# Patient Record
Sex: Male | Born: 1980 | Race: Black or African American | Hispanic: No | Marital: Married | Smoking: Never smoker
Health system: Southern US, Community
[De-identification: ages and names within clinical notes are randomized; demographics above are authoritative.]

## PROBLEM LIST (undated history)

## (undated) DIAGNOSIS — I10 Essential (primary) hypertension: Secondary | ICD-10-CM

---
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
## 2009-03-24 ENCOUNTER — Emergency Department (HOSPITAL_COMMUNITY): Admission: EM | Admit: 2009-03-24 | Discharge: 2009-03-24 | Payer: Self-pay | Admitting: Emergency Medicine

## 2010-06-09 ENCOUNTER — Encounter: Payer: Self-pay | Admitting: Sports Medicine

## 2011-01-27 ENCOUNTER — Emergency Department (HOSPITAL_COMMUNITY)
Admission: EM | Admit: 2011-01-27 | Discharge: 2011-01-27 | Disposition: A | Discharge status: Home / Self Care | Payer: Self-pay | Attending: Emergency Medicine | Admitting: Emergency Medicine

## 2011-01-27 ENCOUNTER — Emergency Department (HOSPITAL_COMMUNITY): Payer: Self-pay

## 2011-01-27 DIAGNOSIS — R05 Cough: Secondary | ICD-10-CM | POA: Insufficient documentation

## 2011-01-27 DIAGNOSIS — R000 Tachycardia, unspecified: Secondary | ICD-10-CM | POA: Insufficient documentation

## 2011-01-27 DIAGNOSIS — R0789 Other chest pain: Secondary | ICD-10-CM | POA: Insufficient documentation

## 2011-01-27 DIAGNOSIS — R071 Chest pain on breathing: Secondary | ICD-10-CM | POA: Insufficient documentation

## 2011-01-27 DIAGNOSIS — R059 Cough, unspecified: Secondary | ICD-10-CM | POA: Insufficient documentation

## 2011-01-27 DIAGNOSIS — R0602 Shortness of breath: Secondary | ICD-10-CM | POA: Insufficient documentation

## 2011-01-27 LAB — POCT I-STAT, CHEM 8
BUN: 9 mg/dL (ref 6–23)
Creatinine, Ser: 1.2 mg/dL (ref 0.50–1.35)
Potassium: 3.9 mEq/L (ref 3.5–5.1)
Sodium: 140 mEq/L (ref 135–145)

## 2011-01-27 LAB — D-DIMER, QUANTITATIVE: D-Dimer, Quant: 1.48 ug/mL-FEU — ABNORMAL HIGH (ref 0.00–0.48)

## 2011-01-27 MED ORDER — IOHEXOL 300 MG/ML  SOLN
100.0000 mL | Freq: Once | INTRAMUSCULAR | Status: AC | PRN
  Administered 2011-01-27: 100 mL via INTRAVENOUS

## 2011-10-12 ENCOUNTER — Encounter (HOSPITAL_COMMUNITY): Payer: Self-pay | Admitting: Family Medicine

## 2011-10-12 ENCOUNTER — Emergency Department (HOSPITAL_COMMUNITY): Payer: Self-pay

## 2011-10-12 ENCOUNTER — Emergency Department (HOSPITAL_COMMUNITY)
Admission: EM | Admit: 2011-10-12 | Discharge: 2011-10-12 | Disposition: A | Discharge status: Home / Self Care | Payer: Self-pay | Attending: Emergency Medicine | Admitting: Emergency Medicine

## 2011-10-12 DIAGNOSIS — I10 Essential (primary) hypertension: Secondary | ICD-10-CM | POA: Insufficient documentation

## 2011-10-12 DIAGNOSIS — R1110 Vomiting, unspecified: Secondary | ICD-10-CM | POA: Insufficient documentation

## 2011-10-12 DIAGNOSIS — M549 Dorsalgia, unspecified: Secondary | ICD-10-CM | POA: Insufficient documentation

## 2011-10-12 DIAGNOSIS — J45909 Unspecified asthma, uncomplicated: Secondary | ICD-10-CM | POA: Insufficient documentation

## 2011-10-12 DIAGNOSIS — J189 Pneumonia, unspecified organism: Secondary | ICD-10-CM | POA: Insufficient documentation

## 2011-10-12 HISTORY — DX: Essential (primary) hypertension: I10

## 2011-10-12 LAB — COMPREHENSIVE METABOLIC PANEL
Alkaline Phosphatase: 88 U/L (ref 39–117)
BUN: 5 mg/dL — ABNORMAL LOW (ref 6–23)
CO2: 25 mEq/L (ref 19–32)
GFR calc Af Amer: >90 mL/min (ref >90)
GFR calc non Af Amer: >90 mL/min (ref >90)
Glucose, Bld: 115 mg/dL — ABNORMAL HIGH (ref 70–99)
Potassium: 4.3 mEq/L (ref 3.5–5.1)
Total Bilirubin: 0.3 mg/dL (ref 0.3–1.2)
Total Protein: 8.2 g/dL (ref 6.0–8.3)

## 2011-10-12 LAB — CBC
Hemoglobin: 14.6 g/dL (ref 13.0–17.0)
MCH: 25.1 pg — ABNORMAL LOW (ref 26.0–34.0)
MCV: 73.5 fL — ABNORMAL LOW (ref 78.0–100.0)
RBC: 5.81 MIL/uL (ref 4.22–5.81)

## 2011-10-12 LAB — DIFFERENTIAL
Eosinophils Absolute: 0 10*3/uL (ref 0.0–0.7)
Lymphs Abs: 1 10*3/uL (ref 0.7–4.0)
Monocytes Relative: 3 % (ref 3–12)
Neutrophils Relative %: 89 % — ABNORMAL HIGH (ref 43–77)

## 2011-10-12 MED ORDER — AZITHROMYCIN 250 MG PO TABS
500.0000 mg | ORAL_TABLET | Freq: Once | ORAL | Status: AC
  Administered 2011-10-12: 500 mg via ORAL
  Filled 2011-10-12: qty 2

## 2011-10-12 MED ORDER — TRIAMTERENE-HCTZ 37.5-25 MG PO TABS: 1.0000 | ORAL_TABLET | Freq: Every day | ORAL | Status: DC

## 2011-10-12 MED ORDER — IBUPROFEN 800 MG PO TABS
800.0000 mg | ORAL_TABLET | Freq: Once | ORAL | Status: AC
  Administered 2011-10-12: 800 mg via ORAL
  Filled 2011-10-12: qty 1

## 2011-10-12 MED ORDER — SODIUM CHLORIDE 0.9 % IV BOLUS (SEPSIS)
1000.0000 mL | Freq: Once | INTRAVENOUS | Status: AC
  Administered 2011-10-12: 1000 mL via INTRAVENOUS

## 2011-10-12 MED ORDER — IOHEXOL 350 MG/ML SOLN
80.0000 mL | Freq: Once | INTRAVENOUS | Status: AC | PRN
  Administered 2011-10-12: 80 mL via INTRAVENOUS

## 2011-10-12 MED ORDER — AZITHROMYCIN 250 MG PO TABS: 250.0000 mg | ORAL_TABLET | Freq: Every day | ORAL | Status: AC

## 2011-10-12 MED ORDER — OXYCODONE-ACETAMINOPHEN 5-325 MG PO TABS: 2.0000 | ORAL_TABLET | ORAL | Status: AC | PRN

## 2011-10-12 MED ORDER — ALBUTEROL SULFATE (5 MG/ML) 0.5% IN NEBU
INHALATION_SOLUTION | RESPIRATORY_TRACT | Status: AC
  Filled 2011-10-12: qty 0.5

## 2011-10-12 MED ORDER — ALBUTEROL SULFATE HFA 108 (90 BASE) MCG/ACT IN AERS
2.0000 | INHALATION_SPRAY | RESPIRATORY_TRACT | Status: DC
  Administered 2011-10-12: 2 via RESPIRATORY_TRACT
  Filled 2011-10-12: qty 6.7

## 2011-10-12 MED ORDER — ALBUTEROL SULFATE (5 MG/ML) 0.5% IN NEBU
5.0000 mg | INHALATION_SOLUTION | Freq: Once | RESPIRATORY_TRACT | Status: AC
  Administered 2011-10-12: 5 mg via RESPIRATORY_TRACT
  Filled 2011-10-12: qty 1

## 2011-10-12 MED ORDER — ONDANSETRON HCL 4 MG/2ML IJ SOLN
4.0000 mg | Freq: Once | INTRAMUSCULAR | Status: AC
  Administered 2011-10-12: 4 mg via INTRAVENOUS
  Filled 2011-10-12: qty 2

## 2011-10-12 MED ORDER — ALBUTEROL SULFATE (5 MG/ML) 0.5% IN NEBU
5.0000 mg | INHALATION_SOLUTION | Freq: Once | RESPIRATORY_TRACT | Status: AC
  Administered 2011-10-12: 5 mg via RESPIRATORY_TRACT
  Filled 2011-10-12: qty 0.5

## 2011-10-12 MED ORDER — OXYCODONE-ACETAMINOPHEN 5-325 MG PO TABS
2.0000 | ORAL_TABLET | Freq: Once | ORAL | Status: AC
  Administered 2011-10-12: 2 via ORAL
  Filled 2011-10-12: qty 2

## 2011-10-12 NOTE — ED Notes (Signed)
Pt. Is drinking gingerale and tolerating

## 2011-10-12 NOTE — ED Notes (Signed)
Discharge instructions given along with 3 prescriptions.  Inhaler given and use instructed

## 2011-10-12 NOTE — ED Provider Notes (Signed)
History     CSN: [id number]  Arrival date & time 10/12/11  1328   First MD Initiated Contact with Patient 10/12/11 1501      Chief Complaint  Patient presents with  . Fever  . Emesis  . Back Pain    (Consider location/radiation/quality/duration/timing/severity/associated sxs/prior treatment) HPI Comments: Pt states that he is out of his medications including his chronic back pain medication:pt states that he has had vomiting without diarrhea;Pt states that he smoked marijuana yesterday to help with the nausea:pt has not taken anything for the fever  Patient is a 31 y.o. male presenting with fever. The history is provided by the patient. No language interpreter was used.  Fever Primary symptoms of the febrile illness include fever, cough, wheezing, shortness of breath and vomiting. Primary symptoms do not include nausea or diarrhea. The current episode started yesterday. This is a new problem. The problem has not changed since onset.   Past Medical History  Diagnosis Date  . Hypertension   . Asthma     History reviewed. No pertinent past surgical history.  History reviewed. No pertinent family history.  History  Substance Use Topics  . Smoking status: Not on file  . Smokeless tobacco: Not on file  . Alcohol Use:       Review of Systems  Constitutional: Positive for fever.  HENT: Negative.   Eyes: Negative.   Respiratory: Positive for cough, shortness of breath and wheezing.   Cardiovascular: Negative.   Gastrointestinal: Positive for vomiting. Negative for nausea and diarrhea.  Genitourinary: Negative.     Allergies  Review of patient's allergies indicates no known allergies.  Home Medications   Current Outpatient Rx  Name Route Sig Dispense Refill  . ALBUTEROL SULFATE HFA 108 (90 BASE) MCG/ACT IN AERS Inhalation Inhale 2 puffs into the lungs every 6 (six) hours as needed. Shortness of breath    . ALPRAZOLAM 1 MG PO TABS Oral Take 1 mg by mouth 2 (two)  times daily as needed. anxiety    . OXYCODONE-ACETAMINOPHEN 10-325 MG PO TABS Oral Take 1 tablet by mouth every 4 (four) hours as needed. Back and dental pain    . TRIAMTERENE-HCTZ 37.5-25 MG PO TABS Oral Take 1 tablet by mouth daily.      BP 162/111  Pulse 135  Temp(Src) 102.8 F (39.3 C) (Oral)  Resp 22  SpO2 95%  Physical Exam  Nursing note and vitals reviewed. Constitutional: He is oriented to person, place, and time. He appears well-developed and well-nourished.  HENT:  Right Ear: External ear normal.  Left Ear: External ear normal.  Mouth/Throat: Posterior oropharyngeal erythema present.  Eyes: Conjunctivae and EOM are normal.  Neck: Normal range of motion. Neck supple.  Cardiovascular: Normal rate and regular rhythm.   Pulmonary/Chest: He has wheezes.  Abdominal: Soft. Bowel sounds are normal. There is no tenderness.  Musculoskeletal: Normal range of motion.  Neurological: He is alert and oriented to person, place, and time. He exhibits normal muscle tone. Coordination normal.  Skin: Skin is warm and dry.  Psychiatric: He has a normal mood and affect.    ED Course  Procedures (including critical care time)  Labs Reviewed  CBC - Abnormal; Notable for the following:    WBC 12.9 (*)    MCV 73.5 (*)    MCH 25.1 (*)    RDW 15.9 (*)    All other components within normal limits  DIFFERENTIAL - Abnormal; Notable for the following:    Neutrophils Relative  89 (*)    Neutro Abs 11.4 (*)    Lymphocytes Relative 7 (*)    All other components within normal limits  COMPREHENSIVE METABOLIC PANEL - Abnormal; Notable for the following:    Glucose, Bld 115 (*)    BUN 5 (*)    All other components within normal limits   Dg Chest 2 View  10/12/2011  *RADIOLOGY REPORT*  Clinical Data: Fever.  Emesis.  Back pain.  CHEST - 2 VIEW  Comparison: 01/27/2011  Findings: Film is very shallow lung inflation.  Heart is enlarged. There is pulmonary vascular congestion without overt edema.  No  focal consolidations or pleural effusions are identified.  IMPRESSION:  1.  Cardiomegaly and pulmonary vascular congestion. 2. No focal pulmonary abnormality.  Original Report Authenticated By: Patterson Hammersmith, M.D.   Ct Angio Chest W/cm &/or Wo Cm  10/12/2011  *RADIOLOGY REPORT*  Clinical Data: Cough, fever, nausea and vomiting.  CT ANGIOGRAPHY CHEST  Technique:  Multidetector CT imaging of the chest using the standard protocol during bolus administration of intravenous contrast. Multiplanar reconstructed images including MIPs were obtained and reviewed to evaluate the vascular anatomy.  Contrast: 80mL OMNIPAQUE IOHEXOL 350 MG/ML SOLN  Comparison: Chest x-ray 10/12/2011  Findings: Normal cardiac size although possible early coronary artery calcification left main coronary artery, premature for the patient's age 35.   No pericardial effusion. There is good opacification of the pulmonary artery and its branches.  No visible pulmonary emboli are seen. Normal aorta and great vessels.  Mild consolidation is seen along the inferior right middle lobe laterally, with slight fluid in the major fissure. There may be mild associated volume loss.  No other infiltrates are seen.  11 x 12 mm right infrahilar lymph node may be reactive.  No other significant mediastinal or hilar lymphadenopathy.  The trachea is midline. No bronchial lesions are seen.  No visible osseous abnormality.  Visualized liver and spleen are unremarkable.  Possible left renal calculi noted.  Normal appearing gallbladder.  Negative adrenal glands.  IMPRESSION: No evidence for pulmonary emboli.  Mild consolidation inferolateral right middle lobe, likely acute pneumonia.  Original Report Authenticated By: Elsie Stain, M.D.    Date: 10/12/2011  Rate: 125  Rhythm: sinus tachycardia  QRS Axis: normal  Intervals: normal  ST/T Wave abnormalities: normal  Conduction Disutrbances:none  Narrative Interpretation:   Old EKG Reviewed: none  available     1. Community acquired pneumonia       MDM  Pt doing better at this time:pneumonia noted on ct which is consistent with clinical picture        Teressa Lower, NP 10/12/11 2042

## 2011-10-12 NOTE — ED Notes (Signed)
Pt sts cough, fever, N,V, back pain and rib pain x 2days. sts chronic pain andis out of medication. Needs HTN meds, pain meds, and albuterol

## 2011-10-12 NOTE — Discharge Instructions (Signed)

## 2011-10-12 NOTE — ED Provider Notes (Signed)
Medical screening examination/treatment/procedure(s) were conducted as a shared visit with non-physician practitioner(s) and myself.  I personally evaluated the patient during the encounter On my exam this young male with a history of asthma is diaphoretic, tachycardic, uncomfortable.  The patient is, however, awake, alert and oriented.  The patient's evaluation is notable for the demonstration of pneumonia.  Cardiac: 121-st, abnormal Pulse ox 99% on [place], abnormal ECG -st 125, abnormal   Gerhard Munch, MD 10/12/11 [phone]

## 2011-10-12 NOTE — ED Notes (Signed)
MD at bedside. 

## 2012-02-24 ENCOUNTER — Emergency Department (HOSPITAL_COMMUNITY)
Admission: EM | Admit: 2012-02-24 | Discharge: 2012-02-24 | Disposition: A | Discharge status: Home / Self Care | Payer: Self-pay | Attending: Emergency Medicine | Admitting: Emergency Medicine

## 2012-02-24 DIAGNOSIS — Z91011 Allergy to milk products: Secondary | ICD-10-CM | POA: Insufficient documentation

## 2012-02-24 DIAGNOSIS — M79609 Pain in unspecified limb: Secondary | ICD-10-CM | POA: Insufficient documentation

## 2012-02-24 DIAGNOSIS — Z91013 Allergy to seafood: Secondary | ICD-10-CM | POA: Insufficient documentation

## 2012-02-24 DIAGNOSIS — M79673 Pain in unspecified foot: Secondary | ICD-10-CM

## 2012-02-24 DIAGNOSIS — J45909 Unspecified asthma, uncomplicated: Secondary | ICD-10-CM | POA: Insufficient documentation

## 2012-02-24 DIAGNOSIS — I10 Essential (primary) hypertension: Secondary | ICD-10-CM | POA: Insufficient documentation

## 2012-02-24 MED ORDER — PREDNISONE 20 MG PO TABS: 40.0000 mg | ORAL_TABLET | Freq: Every day | ORAL | Status: DC

## 2012-02-24 MED ORDER — OXYCODONE-ACETAMINOPHEN 5-325 MG PO TABS: 1.0000 | ORAL_TABLET | Freq: Four times a day (QID) | ORAL | Status: DC | PRN

## 2012-02-24 NOTE — ED Provider Notes (Signed)
History     CSN: [id number]  Arrival date & time 02/24/12  1744   First MD Initiated Contact with Patient 02/24/12 1829      Chief Complaint  Patient presents with  . Foot Pain    (Consider location/radiation/quality/duration/timing/severity/associated sxs/prior treatment) HPI Comments: Patient states that he awoke this morning with left foot pain and swelling. He denies new injury. Patient states that he has had multiple prior episodes of pain and swelling in the same foot that resolves after several days. This has been going on for several months. Patient has never been diagnosed with gout or another cause of the swelling. Patient has been prescribed Percocet in the past. He states that he has been taking this for pain and this helped. He had a course of prednisone in the past which has helped. Otherwise he denies fever, vomiting. Patient states that he had some red meat yesterday but no shellfish. No new medications. Onset gradual. Course is constant. Walking makes the pain worse.  The history is provided by the patient.    Past Medical History  Diagnosis Date  . Hypertension   . Asthma     No past surgical history on file.  No family history on file.  History  Substance Use Topics  . Smoking status: Not on file  . Smokeless tobacco: Not on file  . Alcohol Use:       Review of Systems  Constitutional: Negative for activity change.  HENT: Negative for neck pain.   Musculoskeletal: Positive for joint swelling and arthralgias. Negative for back pain and gait problem.  Skin: Negative for wound.  Neurological: Negative for weakness and numbness.    Allergies  Milk-related compounds and Shellfish allergy  Home Medications   Current Outpatient Rx  Name Route Sig Dispense Refill  . ALBUTEROL SULFATE HFA 108 (90 BASE) MCG/ACT IN AERS Inhalation Inhale 2 puffs into the lungs every 6 (six) hours as needed. Shortness of breath    . ALPRAZOLAM 1 MG PO TABS Oral Take 1 mg  by mouth 2 (two) times daily as needed. anxiety    . OXYCODONE-ACETAMINOPHEN 10-325 MG PO TABS Oral Take 1 tablet by mouth every 4 (four) hours as needed. Back and dental pain    . TRIAMTERENE-HCTZ 37.5-25 MG PO TABS Oral Take 1 tablet by mouth daily.      BP 144/72  Pulse 123  Temp 98.7 F (37.1 C) (Oral)  Resp 18  SpO2 99%  Physical Exam  Nursing note and vitals reviewed. Constitutional: He appears well-developed and well-nourished.  HENT:  Head: Normocephalic and atraumatic.  Eyes: Conjunctivae normal are normal.  Neck: Normal range of motion. Neck supple.  Cardiovascular: Normal pulses.   Pulses:      Dorsalis pedis pulses are 2+ on the right side, and 2+ on the left side.       Posterior tibial pulses are 2+ on the right side, and 2+ on the left side.  Musculoskeletal: He exhibits edema and tenderness.       Patient complains of pain just anterior to the medial malleolus as well as over the fourth and fifth metatarsals. Mild tenderness to palpation in the areas. Generalized mild edema of foot. There is no overlying erythema or redness however the areas are mildly warm to touch.  Neurological: He is alert. No sensory deficit.       Motor, sensation, and vascular distal to the painful areas are fully intact.   Skin: Skin is warm and dry.  Psychiatric: He has a normal mood and affect.    ED Course  Procedures (including critical care time)  Labs Reviewed - No data to display No results found.   1. Foot pain     8:06 PM Patient seen and examined.   Vital signs reviewed and are as follows: Filed Vitals:   02/24/12 1758  BP: 144/72  Pulse: 123  Temp: 98.7 F (37.1 C)  Resp: 18   Patient counseled on rice protocol. Will treat with pain medication and trial of prednisone. Orthopedic referral given. Patient verbalizes understanding and agrees with plan.  Patient counseled on use of narcotic pain medications. Counseled not to combine these medications with others  containing tylenol. Urged not to drink alcohol, drive, or perform any other activities that requires focus while taking these medications. The patient verbalizes understanding and agrees with the plan.    MDM  Recurrent foot swelling and pain. It does not appear classic for gout however gouty arthritis or pseudogout is considered. Will give course of prednisone for this reason. Do not suspect septic arthritis. Do not suspect swelling is from DVT or other vascular issue. 2+ pedal pulses. Conservative management indicated with orthopedic followup given recurrent nature.        Renne Crigler, [place] 02/24/12 2016

## 2012-02-24 NOTE — ED Provider Notes (Signed)
Medical screening examination/treatment/procedure(s) were performed by non-physician practitioner and as supervising physician I was immediately available for consultation/collaboration.   Jozee Hammer Y. Malick Netz, MD 02/24/12 2334 

## 2012-02-24 NOTE — ED Notes (Signed)
Pt states that he woke up with L foot pain and swelling today. No injury. NAD.

## 2013-03-10 IMAGING — CT CT ANGIO CHEST
2 of 6 series · 19 of 46 positions shown · IV contrast (APPLIED)
Comparison: Chest x-ray 10/12/2011

CLINICAL DATA: Cough, fever, nausea and vomiting.

CT ANGIOGRAPHY CHEST
TECHNIQUE: Multidetector CT imaging of the chest using the
standard protocol during bolus administration of intravenous
contrast. Multiplanar reconstructed images including MIPs were
obtained and reviewed to evaluate the vascular anatomy.
Contrast: 80mL OMNIPAQUE IOHEXOL 350 MG/ML SOLN

[Series 5: pulm embolism 1.0 b25f thin · axial · 0.70mm/px · z∈[-310,-37]mm · 16 of 301 slices shown]
[im 14/301  lung]
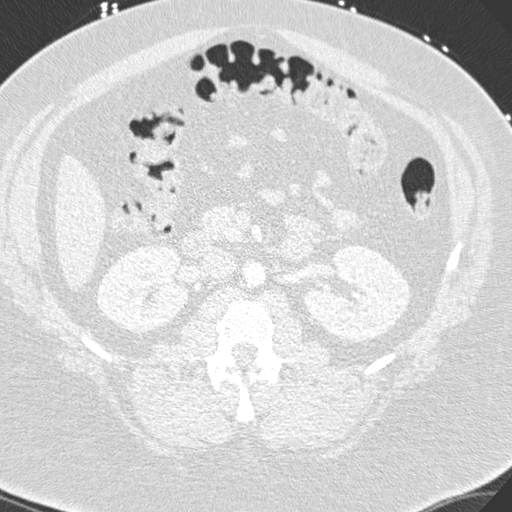
[im 40/301  soft-tissue]
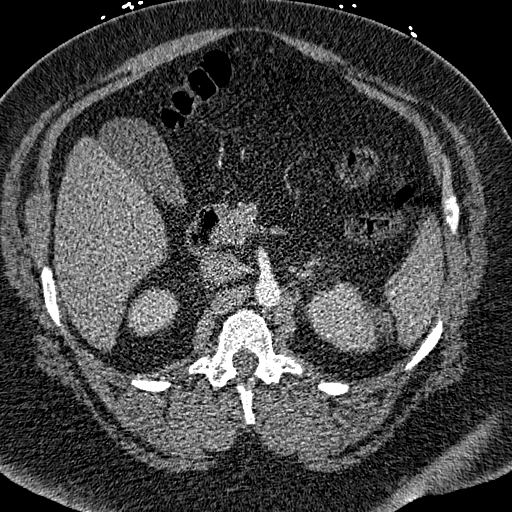
[im 53/301  lung]
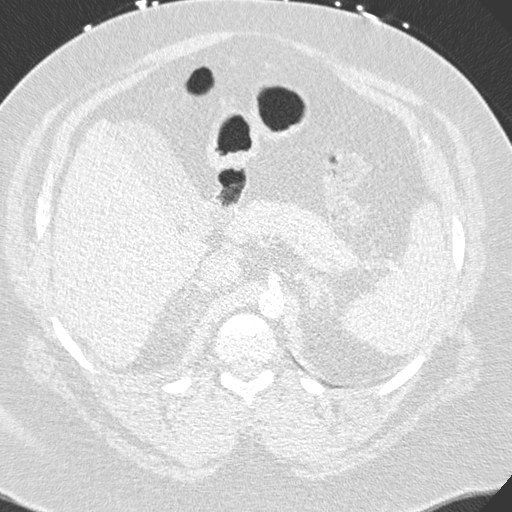
[im 66/301  soft-tissue]
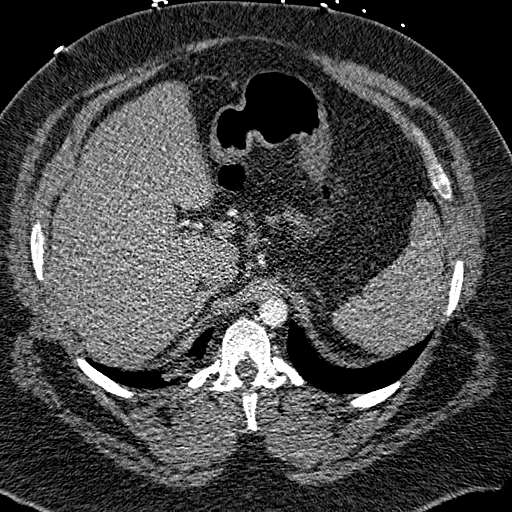
[im 92/301  lung]
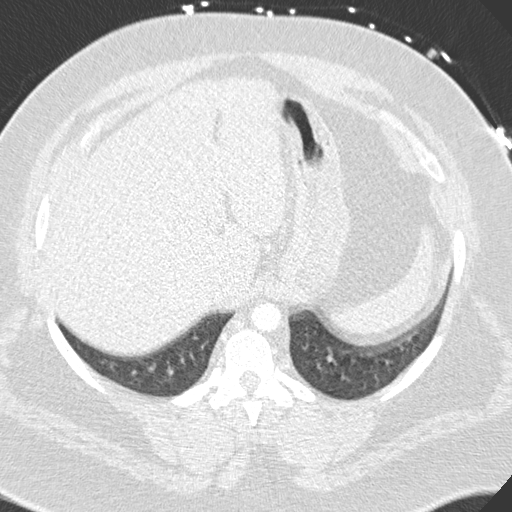
[im 105/301  soft-tissue]
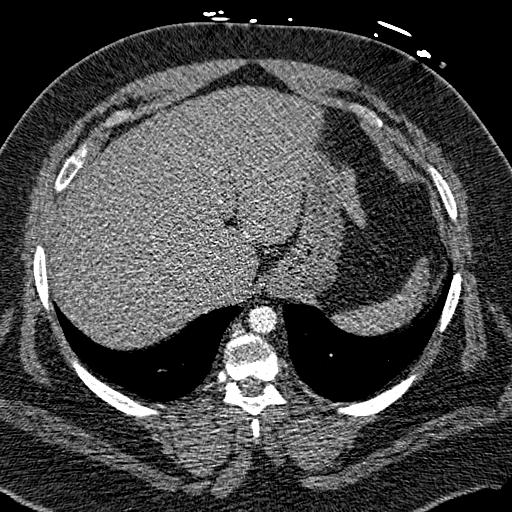
[im 118/301  lung]
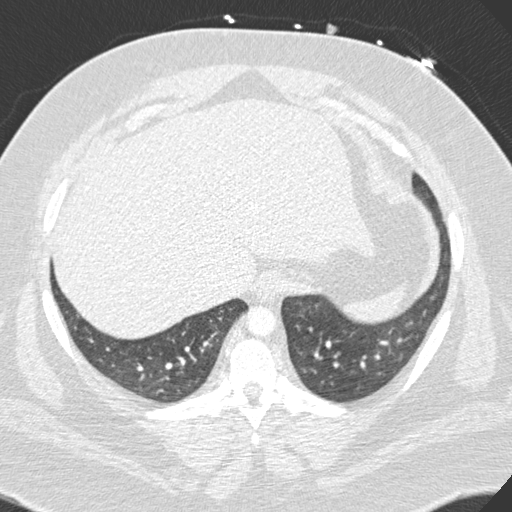
[im 144/301  soft-tissue]
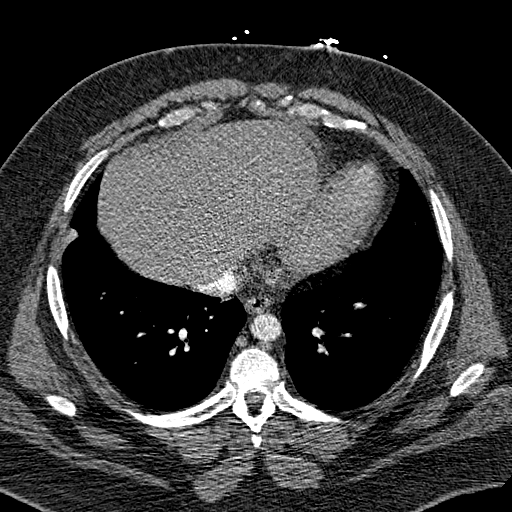
[im 157/301  lung]
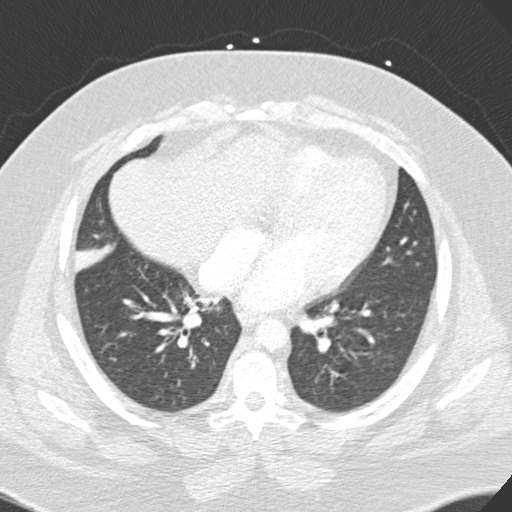
[im 183/301  soft-tissue]
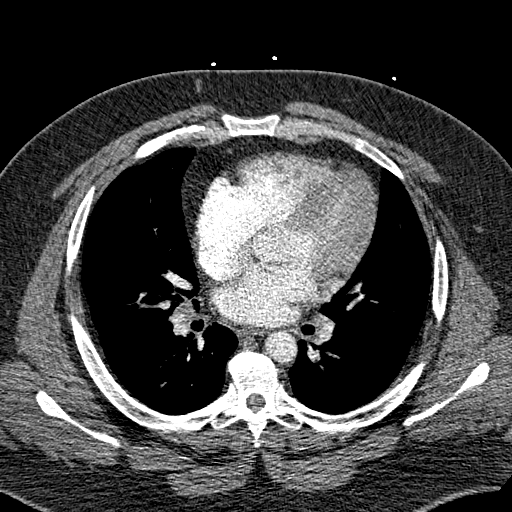
[im 196/301  lung]
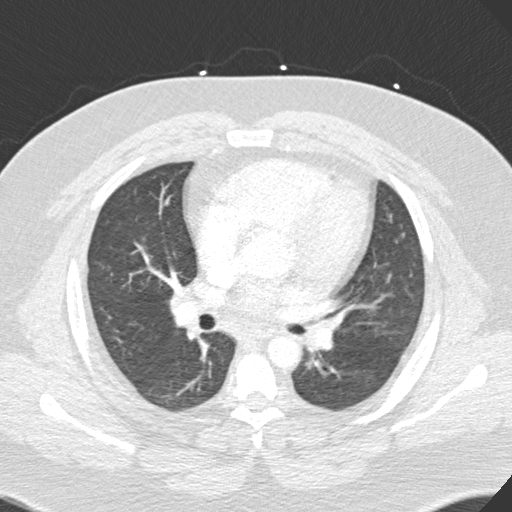
[im 209/301  soft-tissue]
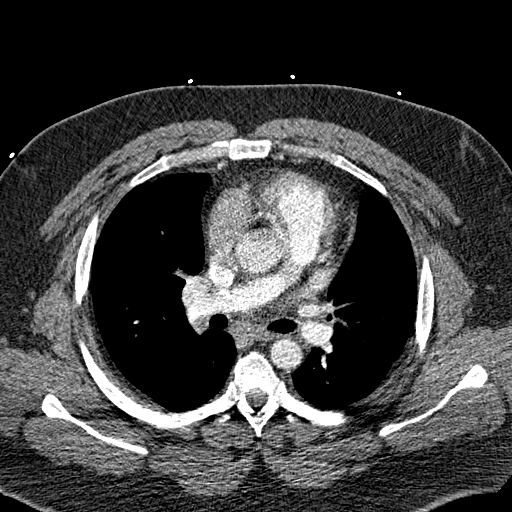
[im 235/301  lung]
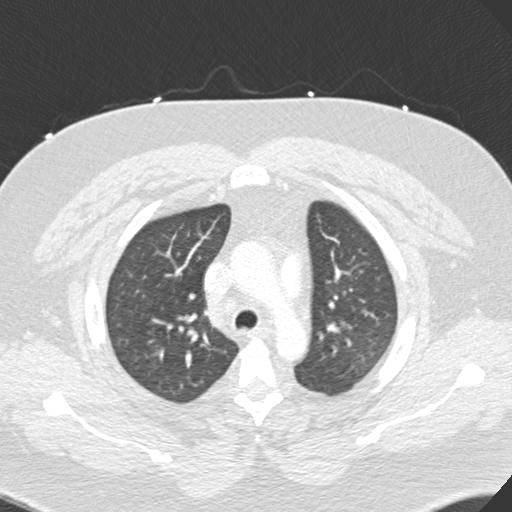
[im 248/301  soft-tissue]
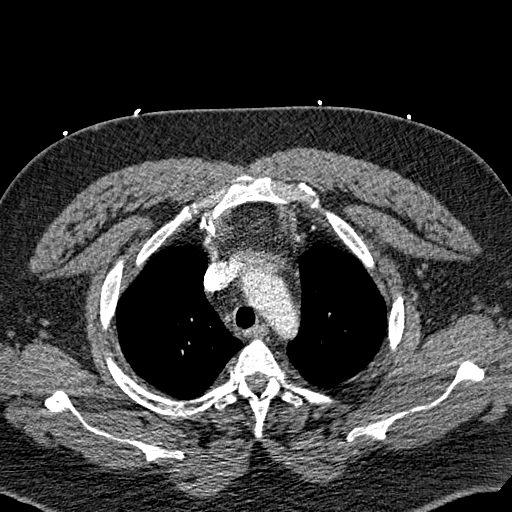
[im 261/301  lung]
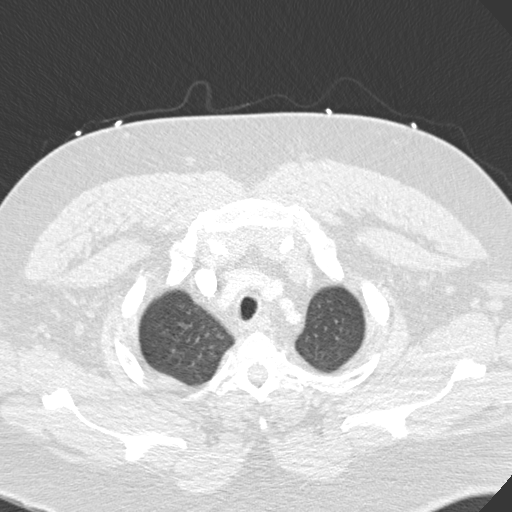
[im 287/301  soft-tissue]
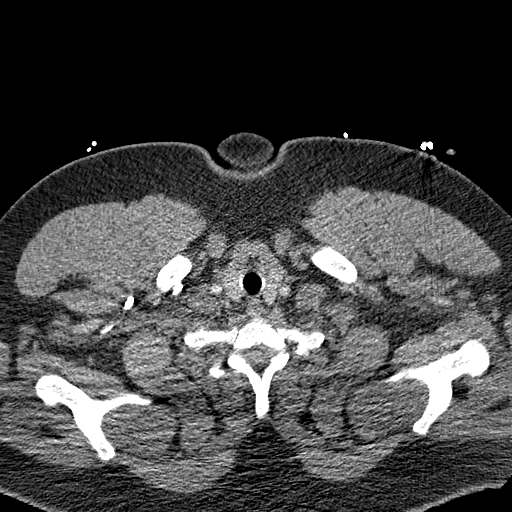

[Series 8: coronals · coronal · 0.61mm/px · 3 of 126 slices shown]
[im 32/126  soft-tissue]
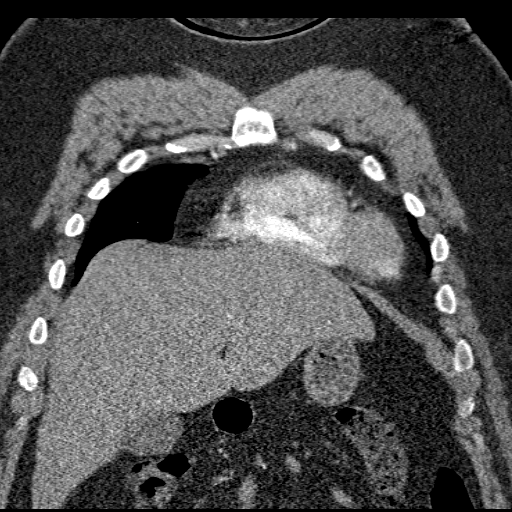
[im 63/126  soft-tissue]
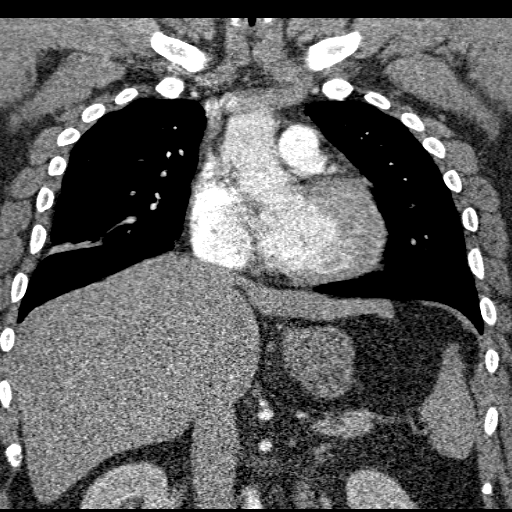
[im 94/126  soft-tissue]
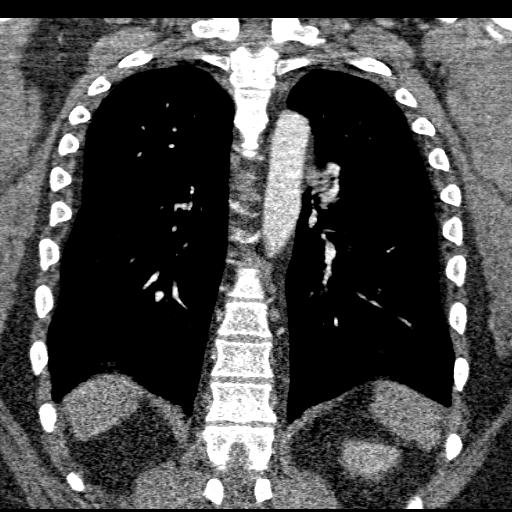

[19 of 46 positions shown; findings below may reference images not displayed]

FINDINGS: Normal cardiac size although possible early coronary
artery calcification left main coronary artery, premature for the
patient's age 30.   No pericardial effusion. There is good
opacification of the pulmonary artery and its branches.  No visible
pulmonary emboli are seen. Normal aorta and great vessels.

Mild consolidation is seen along the inferior right middle lobe
laterally, with slight fluid in the major fissure. There may be
mild associated volume loss.  No other infiltrates are seen.

11 x 12 mm right infrahilar lymph node may be reactive.  No other
significant mediastinal or hilar lymphadenopathy.

The trachea is midline. No bronchial lesions are seen.

No visible osseous abnormality.

Visualized liver and spleen are unremarkable.  Possible left renal
calculi noted.  Normal appearing gallbladder.  Negative adrenal
glands.
IMPRESSION: No evidence for pulmonary emboli.

Mild consolidation inferolateral right middle lobe, likely acute
pneumonia.

## 2013-10-13 ENCOUNTER — Encounter (HOSPITAL_COMMUNITY): Payer: Self-pay | Admitting: Emergency Medicine

## 2013-10-13 ENCOUNTER — Emergency Department (HOSPITAL_COMMUNITY)
Admission: EM | Admit: 2013-10-13 | Discharge: 2013-10-13 | Disposition: A | Discharge status: Home / Self Care | Payer: Medicaid - Out of State | Attending: Emergency Medicine | Admitting: Emergency Medicine

## 2013-10-13 DIAGNOSIS — K029 Dental caries, unspecified: Secondary | ICD-10-CM | POA: Insufficient documentation

## 2013-10-13 DIAGNOSIS — K006 Disturbances in tooth eruption: Secondary | ICD-10-CM | POA: Insufficient documentation

## 2013-10-13 DIAGNOSIS — J45909 Unspecified asthma, uncomplicated: Secondary | ICD-10-CM | POA: Insufficient documentation

## 2013-10-13 DIAGNOSIS — K089 Disorder of teeth and supporting structures, unspecified: Secondary | ICD-10-CM | POA: Insufficient documentation

## 2013-10-13 DIAGNOSIS — K0889 Other specified disorders of teeth and supporting structures: Secondary | ICD-10-CM

## 2013-10-13 DIAGNOSIS — I10 Essential (primary) hypertension: Secondary | ICD-10-CM | POA: Insufficient documentation

## 2013-10-13 DIAGNOSIS — IMO0002 Reserved for concepts with insufficient information to code with codable children: Secondary | ICD-10-CM | POA: Insufficient documentation

## 2013-10-13 DIAGNOSIS — Z79899 Other long term (current) drug therapy: Secondary | ICD-10-CM | POA: Insufficient documentation

## 2013-10-13 MED ORDER — LIDOCAINE HCL 2 % IJ SOLN
5.0000 mL | Freq: Once | INTRAMUSCULAR | Status: AC
  Administered 2013-10-13: 100 mg
  Filled 2013-10-13: qty 20

## 2013-10-13 MED ORDER — NAPROXEN 500 MG PO TABS: 500.0000 mg | ORAL_TABLET | Freq: Two times a day (BID) | ORAL | Status: DC

## 2013-10-13 MED ORDER — BUPIVACAINE-EPINEPHRINE (PF) 0.5% -1:200000 IJ SOLN
1.8000 mL | Freq: Once | INTRAMUSCULAR | Status: AC
Start: 2013-10-13 — End: 2013-10-13
  Administered 2013-10-13: 1.8 mL
  Filled 2013-10-13: qty 1.8

## 2013-10-13 MED ORDER — CLINDAMYCIN HCL 150 MG PO CAPS: 300.0000 mg | ORAL_CAPSULE | Freq: Four times a day (QID) | ORAL | Status: DC

## 2013-10-13 MED ORDER — HYDROCODONE-ACETAMINOPHEN 5-325 MG PO TABS: ORAL_TABLET | ORAL | Status: DC

## 2013-10-13 NOTE — ED Notes (Addendum)
Pt has had worsening dental pain for the past 4 days. Pt has a broken left lower molar that he has an appointment to get fixed on June 4th, but states the pain is now unbearable. Pt states that drinking water is the only thing to help with his pain.

## 2013-10-13 NOTE — ED Provider Notes (Signed)
CSN: [id number]     Arrival date & time 10/13/13  1624 History  This chart was scribed for Rhea Bleacher PA-C working with Rolland Porter, MD by Ashley Jacobs, ED scribe. This patient was seen in room WTR7/WTR7 and the patient's care was started at 5:02 PM.  First MD Initiated Contact with Patient 10/13/13 1629     Chief Complaint  Patient presents with  . Dental Pain     (Consider location/radiation/quality/duration/timing/severity/associated sxs/prior Treatment) The history is provided by the patient and medical records. No language interpreter was used.   HPI Comments: James Vasquez is a 33 y.o. male who presents to the Emergency Department complaining of gradually worsening dental pain, onset four days ago. He has a fractured left lower molar. He has associated facial swelling. Pt has an appointment to have the tooth extracted next week but he states the pain is unbearable. Pt is only able drink water due to pain. He started taking penicillin for three days ago. Pt does not have any allergies to medication.    Past Medical History  Diagnosis Date  . Hypertension   . Asthma    No past surgical history on file. No family history on file. History  Substance Use Topics  . Smoking status: Not on file  . Smokeless tobacco: Not on file  . Alcohol Use:     Review of Systems  Constitutional: Negative for fever.  HENT: Positive for dental problem and facial swelling. Negative for ear pain, sore throat and trouble swallowing.   Respiratory: Negative for shortness of breath and stridor.   Musculoskeletal: Negative for neck pain.  Skin: Negative for color change.  Neurological: Negative for headaches.      Allergies  Milk-related compounds and Shellfish allergy  Home Medications   Prior to Admission medications   Medication Sig Start Date End Date Taking? Authorizing Provider  albuterol (PROVENTIL HFA;VENTOLIN HFA) 108 (90 BASE) MCG/ACT inhaler Inhale 2 puffs into the lungs every  6 (six) hours as needed. Shortness of breath    Historical Provider, MD  ALPRAZolam Prudy Feeler) 1 MG tablet Take 1 mg by mouth 2 (two) times daily as needed. anxiety    Historical Provider, MD  oxyCODONE-acetaminophen (PERCOCET/ROXICET) 5-325 MG per tablet Take 1-2 tablets by mouth every 6 (six) hours as needed for pain. 02/24/12   Renne Crigler, PA-C  predniSONE (DELTASONE) 20 MG tablet Take 2 tablets (40 mg total) by mouth daily. 02/24/12   Renne Crigler, PA-C  triamterene-hydrochlorothiazide (MAXZIDE-25) 37.5-25 MG per tablet Take 1 tablet by mouth daily.    Historical Provider, MD   BP 153/101  Pulse 84  Resp 20  SpO2 100% Physical Exam  Nursing note and vitals reviewed. Constitutional: He appears well-developed and well-nourished.  HENT:  Head: Normocephalic and atraumatic.  Right Ear: Tympanic membrane, external ear and ear canal normal.  Left Ear: Tympanic membrane, external ear and ear canal normal.  Nose: Nose normal.  Mouth/Throat: Uvula is midline, oropharynx is clear and moist and mucous membranes are normal. No trismus in the jaw. Abnormal dentition. Dental caries present. No dental abscesses or uvula swelling. No tonsillar abscesses.  Patient with L mandibular tooth pain and tenderness to palpation in area of first molar. Mild swelling and erythema of gums noted on exam. No gross facial swelling.  Eyes: Pupils are equal, round, and reactive to light.  Neck: Normal range of motion. Neck supple.  No neck swelling or Lugwig's angina  Neurological: He is alert.  Skin: Skin is warm  and dry.  Psychiatric: He has a normal mood and affect.    ED Course  Procedures (including critical care time) DIAGNOSTIC STUDIES: Oxygen Saturation is 100% on room air, normal by my interpretation.    COORDINATION OF CARE:  5:05 PM Discussed course of care with pt which includes dental block and pain medication. Pt understands and agrees.   Labs Review Labs Reviewed - No data to  display  Imaging Review No results found.   EKG Interpretation None      5:31 PM Patient seen and examined.    Vital signs reviewed and are as follows: Filed Vitals:   10/13/13 1639  BP: 153/101  Pulse: 84  Resp: 20   Dental block was performed. 1.665mL of 2% lidocaine without epi was combined with 1.228mL 0.5% bupivacaine with epi and an alveolar block was performed. Injections made at base of tooth as well as the tooth immediately posterior and anterior to tooth. Adequate anesthesia was obtained. Minimal bleeding after injections. Patient tolerated procedure well with no immediate complications.   Patient counseled on use of narcotic pain medications. Counseled not to combine these medications with others containing tylenol. Urged not to drink alcohol, drive, or perform any other activities that requires focus while taking these medications. The patient verbalizes understanding and agrees with the plan.  Patient counseled to take prescribed medications as directed, return with worsening facial or neck swelling, and to follow-up with their dentist as soon as possible.    MDM   Final diagnoses:  Pain, dental   Patient with toothache. No fever. Exam unconcerning for Ludwig's angina or other deep tissue infection in neck.   As there is gum swelling, erythema, and reported facial swelling, will treat with antibiotic and pain medicine. Patient not improved with penicillin so clindamycin prescribed. Urged patient to follow-up with dentist.        Renne CriglerJoshua Deolinda Frid, PA-C 10/13/13 1732

## 2013-10-13 NOTE — Discharge Instructions (Signed)
Please read and follow all provided instructions.  Your diagnoses today include:  1. Pain, dental     The exam and treatment you received today has been provided on an emergency basis only. This is not a substitute for complete medical or dental care.  Tests performed today include:  Vital signs. See below for your results today.   Medications prescribed:   Vicodin (hydrocodone/acetaminophen) - narcotic pain medication  DO NOT drive or perform any activities that require you to be awake and alert because this medicine can make you drowsy. BE VERY CAREFUL not to take multiple medicines containing Tylenol (also called acetaminophen). Doing so can lead to an overdose which can damage your liver and cause liver failure and possibly death.   Naproxen - anti-inflammatory pain medication  Do not exceed 500mg  naproxen every 12 hours, take with food  You have been prescribed an anti-inflammatory medication or NSAID. Take with food. Take smallest effective dose for the shortest duration needed for your pain. Stop taking if you experience stomach pain or vomiting.    Clindamycin - antibiotic  You have been prescribed an antibiotic medicine: take the entire course of medicine even if you are feeling better. Stopping early can cause the antibiotic not to work.  Take any prescribed medications only as directed.  Home care instructions:  Follow any educational materials contained in this packet.  Follow-up instructions: Please follow-up with your dentist for further evaluation of your symptoms. If you do not have a dentist or primary care doctor -- see below for referral information.   Dental Assistance: See below for dental referrals  Return instructions:   Please return to the Emergency Department if you experience worsening symptoms.  Please return if you develop a fever, you develop more swelling in your face or neck, you have trouble breathing or swallowing food.  Please return if  you have any other emergent concerns.  Additional Information:  Your vital signs today were: BP 153/101   Pulse 84   Resp 20   SpO2 100% If your blood pressure (BP) was elevated above 135/85 this visit, please have this repeated by your doctor within one month. -------------- Dental Care: Organization         Address  Phone  Notes  [facility] Department of [facility] [address] [place], [place] [phone] Accepts children up to age 65 who are enrolled in [place] or [place] Health Choice; pregnant women with a Medicaid card; and children who have applied for Medicaid or [place] Health Choice, but were declined, whose parents can pay a reduced fee at time of service.  [facility] Department of [facility]  [address], [place] [phone] Accepts children up to age 16 who are enrolled in [place] or [place] Health Choice; pregnant women with a Medicaid card; and children who have applied for Medicaid or [place] Health Choice, but were declined, whose parents can pay a reduced fee at time of service.  [facility]  [address] [place], [place] [phone] Patients are seen by appointment only. Walk-ins are not accepted. [facility] will see patients 12 years of age and older. Monday - Tuesday (8am-5pm) Most Wednesdays (8:30-5pm) $30 per visit, cash only  [facility]  [address], [facility] [phone] Patients are seen by appointment only. Walk-ins are not accepted. [facility] will see patients 87 years of age and older. One Wednesday  Evening (Monthly: Volunteer Based).  $30 per visit, cash only  [facility]  [phone] for adults; Children under age 464, call Graduate Pediatric Dentistry at [phone]. Children aged 164-14, please call [phone] to request a pediatric application.  Dental services are provided in all areas of dental care  including fillings, crowns and bridges, complete and partial dentures, implants, gum treatment, root canals, and extractions. Preventive care is also provided. Treatment is provided to both adults and children. Patients are selected via a lottery and there is often a waiting list.   [facility] [address], [place]  [phone] [email or web address]   [facility] [address], [place], [place] [phone], [phone]. [phone] Second and Fourth Thursday of each month, opens at 6:30 AM; Clinic ends at 9 AM.  Patients are seen on a first-come first-served basis, and a limited number are seen during each clinic.   [facility]  [address], [place], [place] [phone]   Eligibility Requirements You must have lived in [place], [place], or [place] for at least the last three months.   You cannot be eligible for state or federal sponsored [place] insurance, including [employer], [place], or [employer]are.   You generally cannot be eligible for healthcare insurance through your employer.    How to apply: Eligibility screenings are held every Tuesday and Wednesday afternoon from 1:00 pm until 4:00 pm. You do not need an appointment for the interview!  [facility] [address], [place], [place] [phone]   [facility]  [phone]   [facility]  [phone]   [facility]  [phone]

## 2013-10-17 NOTE — ED Provider Notes (Signed)
Medical screening examination/treatment/procedure(s) were performed by non-physician practitioner and as supervising physician I was immediately available for consultation/collaboration.   EKG Interpretation None        Vinnie Gombert, MD 10/17/13 1622 

## 2014-01-19 ENCOUNTER — Encounter (HOSPITAL_COMMUNITY): Payer: Self-pay | Admitting: Emergency Medicine

## 2014-01-19 ENCOUNTER — Emergency Department (HOSPITAL_COMMUNITY)
Admission: EM | Admit: 2014-01-19 | Discharge: 2014-01-19 | Disposition: A | Discharge status: Home / Self Care | Payer: Medicaid - Out of State | Attending: Emergency Medicine | Admitting: Emergency Medicine

## 2014-01-19 DIAGNOSIS — Z79899 Other long term (current) drug therapy: Secondary | ICD-10-CM | POA: Insufficient documentation

## 2014-01-19 DIAGNOSIS — J45909 Unspecified asthma, uncomplicated: Secondary | ICD-10-CM | POA: Insufficient documentation

## 2014-01-19 DIAGNOSIS — I10 Essential (primary) hypertension: Secondary | ICD-10-CM | POA: Insufficient documentation

## 2014-01-19 DIAGNOSIS — K0889 Other specified disorders of teeth and supporting structures: Secondary | ICD-10-CM

## 2014-01-19 DIAGNOSIS — K089 Disorder of teeth and supporting structures, unspecified: Secondary | ICD-10-CM | POA: Insufficient documentation

## 2014-01-19 MED ORDER — PENICILLIN V POTASSIUM 500 MG PO TABS
500.0000 mg | ORAL_TABLET | Freq: Four times a day (QID) | ORAL | Status: DC
  Administered 2014-01-19: 500 mg via ORAL
  Filled 2014-01-19: qty 1

## 2014-01-19 MED ORDER — BUPIVACAINE HCL (PF) 0.5 % IJ SOLN
10.0000 mL | Freq: Once | INTRAMUSCULAR | Status: AC
  Administered 2014-01-19: 10 mL
  Filled 2014-01-19: qty 30

## 2014-01-19 MED ORDER — PENICILLIN V POTASSIUM 500 MG PO TABS: 500.0000 mg | ORAL_TABLET | Freq: Four times a day (QID) | ORAL | Status: DC

## 2014-01-19 MED ORDER — PENICILLIN V POTASSIUM 500 MG PO TABS: 500.0000 mg | ORAL_TABLET | Freq: Four times a day (QID) | ORAL | Status: AC

## 2014-01-19 MED ORDER — OXYCODONE-ACETAMINOPHEN 5-325 MG PO TABS: 2.0000 | ORAL_TABLET | Freq: Four times a day (QID) | ORAL | Status: AC | PRN

## 2014-01-19 NOTE — ED Provider Notes (Signed)
CSN: [id number]     Arrival date & time 01/19/14  4098 History   First MD Initiated Contact with Patient 01/19/14 [phone]     Chief Complaint  Patient presents with  . Dental Pain      Patient is a 33 y.o. male presenting with tooth pain.  Dental Pain Associated symptoms: no fever    Pt is a 33 y/o male w/ PMH of HTN and asthma who presents to the ED for lower left tooth pain since Friday. He has not been able to get sleep since then and describes pain as a non radiating nerve pain. He rates pain as 1000/10.  He has tried motrin, salt water, and BC powder w/o relief. He has been drinking cases of water b/c the water pressure helps w/ pain. He has a dentist appt Monday in [place] which is where he has insurance.   He has been seen in the ED in the past for tooth pain and received lidocaine injections, clindamycin, and oxycodone. He still had left over abx and pain meds which he last used on Friday. Denies fevers and chills. Positive for night sweats.   Past Medical History  Diagnosis Date  . Hypertension   . Asthma    History reviewed. No pertinent past surgical history. History reviewed. No pertinent family history. History  Substance Use Topics  . Smoking status: Never Smoker   . Smokeless tobacco: Not on file  . Alcohol Use: Yes    Review of Systems  Constitutional: Negative for fever and chills.  HENT: Negative for ear pain.       Allergies  Milk-related compounds and Shellfish allergy  Home Medications   Prior to Admission medications   Medication Sig Start Date End Date Taking? Authorizing Provider  albuterol (PROVENTIL HFA;VENTOLIN HFA) 108 (90 BASE) MCG/ACT inhaler Inhale 2 puffs into the lungs every 6 (six) hours as needed. Shortness of breath   Yes Historical Provider, MD  ALPRAZolam (XANAX) 1 MG tablet Take 1 mg by mouth 2 (two) times daily as needed. anxiety   Yes Historical Provider, MD  triamterene-hydrochlorothiazide (MAXZIDE-25) 37.5-25 MG per tablet Take 1  tablet by mouth daily.   Yes Historical Provider, MD  oxyCODONE-acetaminophen (ROXICET) 5-325 MG per tablet Take 2 tablets by mouth every 6 (six) hours as needed for severe pain. 01/19/14   Gara Kroner, MD  penicillin v potassium (VEETID) 500 MG tablet Take 1 tablet (500 mg total) by mouth every 6 (six) hours. 01/19/14   Gara Kroner, MD   BP 158/88  Pulse 81  Temp(Src) 98.3 F (36.8 C) (Oral)  Resp 16  SpO2 99% Physical Exam  Constitutional: No distress.  HENT:  Mouth/Throat: No oropharyngeal exudate.  Lt lower mandible with last molar that is cracked w/ no nerve root exposure, bleeding, or pus. Gums are pink. There is some lt sided swelling compared to rt side. Pt is non tender to palpation of tooth put has tenderness when palpating lt lateral gum area. Upper lt side of mandible has a tooth that has a small 1mm open hole.   Cardiovascular: Normal rate and regular rhythm.   Pulmonary/Chest: Effort normal and breath sounds normal. He has no wheezes.  Abdominal: Soft. Bowel sounds are normal. There is no tenderness.  Lymphadenopathy:    He has no cervical adenopathy.    ED Course  Procedures (including critical care time) Supraperiosteal buccal and inferior alvelolar marcaine injections. Pt tolerated procedure well.   Labs Review Labs Reviewed - No data  to display  Imaging Review No results found.   EKG Interpretation None      MDM   Final diagnoses:  Tooth pain    Pt presents w/ tooth pain that is unbearable for him. Denies fever and no cervical lymphadenopathy. Given rx for pencillin  q6h x 7 days, lidocaine injection in lower and upper left side of mandible. Given rx for oxycodone 5-325mg  2tabs q6 hours prn for pain #8 tablets. Has dentist appt. Tomorrow.       Gara Kroner, MD 01/19/14 1110  Derwood Kaplan, MD 01/20/14 [phone]

## 2014-01-19 NOTE — ED Notes (Signed)
Called pt to let him know he left his keys in the department

## 2014-01-19 NOTE — ED Notes (Signed)
Pt reports left sided upper and lower jaw dental pain since Friday. Pain 10/10. Pt has had previous issues with teeth. Pt has an appointment for bottom tooth to be removed, the upper left jaw tooth started hurting in last week or two. Pt has HTN and has not taken his medications this morning.
